# Patient Record
Sex: Male | Born: 1995 | Hispanic: Yes | Marital: Single | Smoking: Light tobacco smoker
Health system: Southern US, Community
[De-identification: ages and names within clinical notes are randomized; demographics above are authoritative.]

---
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
## 2017-10-14 ENCOUNTER — Emergency Department (HOSPITAL_COMMUNITY): Payer: No Typology Code available for payment source

## 2017-10-14 ENCOUNTER — Emergency Department (HOSPITAL_COMMUNITY)
Admission: EM | Admit: 2017-10-14 | Discharge: 2017-10-14 | Disposition: A | Discharge status: Home / Self Care | Payer: No Typology Code available for payment source | Attending: Emergency Medicine | Admitting: Emergency Medicine

## 2017-10-14 ENCOUNTER — Encounter (HOSPITAL_COMMUNITY): Payer: Self-pay

## 2017-10-14 ENCOUNTER — Other Ambulatory Visit: Payer: Self-pay

## 2017-10-14 DIAGNOSIS — Y939 Activity, unspecified: Secondary | ICD-10-CM | POA: Diagnosis not present

## 2017-10-14 DIAGNOSIS — Y929 Unspecified place or not applicable: Secondary | ICD-10-CM | POA: Diagnosis not present

## 2017-10-14 DIAGNOSIS — S29012A Strain of muscle and tendon of back wall of thorax, initial encounter: Secondary | ICD-10-CM | POA: Diagnosis not present

## 2017-10-14 DIAGNOSIS — F17210 Nicotine dependence, cigarettes, uncomplicated: Secondary | ICD-10-CM | POA: Insufficient documentation

## 2017-10-14 DIAGNOSIS — Z041 Encounter for examination and observation following transport accident: Secondary | ICD-10-CM | POA: Diagnosis present

## 2017-10-14 DIAGNOSIS — Y999 Unspecified external cause status: Secondary | ICD-10-CM | POA: Diagnosis not present

## 2017-10-14 MED ORDER — IBUPROFEN 800 MG PO TABS
800.0000 mg | ORAL_TABLET | Freq: Once | ORAL | Status: AC
  Administered 2017-10-14: 800 mg via ORAL
  Filled 2017-10-14: qty 1

## 2017-10-14 MED ORDER — MELOXICAM 15 MG PO TABS: 15.0000 mg | ORAL_TABLET | Freq: Every day | ORAL | 0 refills | Status: AC

## 2017-10-14 MED ORDER — BACLOFEN 10 MG PO TABS: 10.0000 mg | ORAL_TABLET | Freq: Three times a day (TID) | ORAL | 0 refills | Status: AC
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
## 2017-10-14 NOTE — Discharge Instructions (Addendum)
